# Patient Record
Sex: Male | Born: 1965 | Race: Black or African American | Hispanic: No | Marital: Married | Smoking: Current every day smoker
Health system: Southern US, Community
[De-identification: ages and names within clinical notes are randomized; demographics above are authoritative.]

## PROBLEM LIST (undated history)

## (undated) DIAGNOSIS — M549 Dorsalgia, unspecified: Secondary | ICD-10-CM

## (undated) HISTORY — PX: INGUINAL HERNIA REPAIR: SHX194

---

## 2009-09-22 ENCOUNTER — Emergency Department: Payer: Self-pay | Admitting: Emergency Medicine

## 2009-09-30 ENCOUNTER — Ambulatory Visit: Payer: Self-pay | Admitting: Podiatry

## 2012-03-10 ENCOUNTER — Emergency Department: Payer: Self-pay | Admitting: *Deleted

## 2015-09-10 ENCOUNTER — Ambulatory Visit: Payer: Self-pay | Admitting: Podiatry

## 2016-10-24 ENCOUNTER — Emergency Department: Payer: Self-pay

## 2016-10-24 ENCOUNTER — Encounter: Payer: Self-pay | Admitting: Intensive Care

## 2016-10-24 ENCOUNTER — Emergency Department
Admission: EM | Admit: 2016-10-24 | Discharge: 2016-10-24 | Disposition: A | Discharge status: Home / Self Care | Payer: Self-pay | Attending: Emergency Medicine | Admitting: Emergency Medicine

## 2016-10-24 DIAGNOSIS — N390 Urinary tract infection, site not specified: Secondary | ICD-10-CM | POA: Insufficient documentation

## 2016-10-24 DIAGNOSIS — R51 Headache: Secondary | ICD-10-CM | POA: Insufficient documentation

## 2016-10-24 DIAGNOSIS — J329 Chronic sinusitis, unspecified: Secondary | ICD-10-CM | POA: Insufficient documentation

## 2016-10-24 DIAGNOSIS — F17210 Nicotine dependence, cigarettes, uncomplicated: Secondary | ICD-10-CM | POA: Insufficient documentation

## 2016-10-24 LAB — CBC WITH DIFFERENTIAL/PLATELET
BASOS PCT: 2 %
Band Neutrophils: 2 %
Basophils Absolute: 0.1 10*3/uL (ref 0–0.1)
Blasts: 0 %
EOS PCT: 2 %
Eosinophils Absolute: 0.1 10*3/uL (ref 0–0.7)
HCT: 41 % (ref 40.0–52.0)
Hemoglobin: 13.9 g/dL (ref 13.0–18.0)
LYMPHS ABS: 0.7 10*3/uL — AB (ref 1.0–3.6)
Lymphocytes Relative: 9 %
MCH: 33.7 pg (ref 26.0–34.0)
MCHC: 34 g/dL (ref 32.0–36.0)
MCV: 99.1 fL (ref 80.0–100.0)
MONO ABS: 1.1 10*3/uL — AB (ref 0.2–1.0)
MYELOCYTES: 0 %
Metamyelocytes Relative: 0 %
Monocytes Relative: 15 %
NEUTROS PCT: 68 %
NRBC: 0 /100{WBCs}
Neutro Abs: 5.1 10*3/uL (ref 1.4–6.5)
OTHER: 2 %
PLATELETS: 231 10*3/uL (ref 150–440)
Promyelocytes Absolute: 0 %
RBC: 4.14 MIL/uL — ABNORMAL LOW (ref 4.40–5.90)
RDW: 12.8 % (ref 11.5–14.5)
WBC: 7.3 10*3/uL (ref 3.8–10.6)

## 2016-10-24 LAB — URINALYSIS COMPLETE WITH MICROSCOPIC (ARMC ONLY)
BILIRUBIN URINE: NEGATIVE
Bilirubin Urine: NEGATIVE
GLUCOSE, UA: NEGATIVE mg/dL
GLUCOSE, UA: NEGATIVE mg/dL
NITRITE: POSITIVE — AB
Nitrite: POSITIVE — AB
Protein, ur: 30 mg/dL — AB
Protein, ur: NEGATIVE mg/dL
SPECIFIC GRAVITY, URINE: 1.015 (ref 1.005–1.030)
Specific Gravity, Urine: 1.016 (ref 1.005–1.030)
pH: 5 (ref 5.0–8.0)
pH: 5 (ref 5.0–8.0)

## 2016-10-24 LAB — COMPREHENSIVE METABOLIC PANEL
ALBUMIN: 4.1 g/dL (ref 3.5–5.0)
ALK PHOS: 62 U/L (ref 38–126)
ALT: 20 U/L (ref 17–63)
ANION GAP: 5 (ref 5–15)
AST: 29 U/L (ref 15–41)
BUN: 10 mg/dL (ref 6–20)
CALCIUM: 9.4 mg/dL (ref 8.9–10.3)
CO2: 28 mmol/L (ref 22–32)
Chloride: 107 mmol/L (ref 101–111)
Creatinine, Ser: 0.94 mg/dL (ref 0.61–1.24)
GFR calc Af Amer: >60 mL/min (ref >60)
GFR calc non Af Amer: >60 mL/min (ref >60)
GLUCOSE: 97 mg/dL (ref 65–99)
Potassium: 4.3 mmol/L (ref 3.5–5.1)
SODIUM: 140 mmol/L (ref 135–145)
Total Bilirubin: 0.7 mg/dL (ref 0.3–1.2)
Total Protein: 7.4 g/dL (ref 6.5–8.1)

## 2016-10-24 LAB — LIPASE, BLOOD: Lipase: 19 U/L (ref 11–51)

## 2016-10-24 MED ORDER — IOPAMIDOL (ISOVUE-300) INJECTION 61%
30.0000 mL | Freq: Once | INTRAVENOUS | Status: AC | PRN
  Administered 2016-10-24: 30 mL via ORAL

## 2016-10-24 MED ORDER — AMOXICILLIN-POT CLAVULANATE 875-125 MG PO TABS: 1.0000 | ORAL_TABLET | Freq: Two times a day (BID) | ORAL | 0 refills | Status: AC

## 2016-10-24 MED ORDER — IOPAMIDOL (ISOVUE-300) INJECTION 61%
100.0000 mL | Freq: Once | INTRAVENOUS | Status: AC | PRN
  Administered 2016-10-24: 100 mL via INTRAVENOUS

## 2016-10-24 NOTE — ED Triage Notes (Signed)
Pt presents to ER with c/o Lower abdominal/pelvic pain that started this AM. Pt states he had an episode of emesis this AM. Denies any urinary symptoms or abnormal discharge. Pt ambulatory in triage

## 2016-10-24 NOTE — ED Notes (Signed)
EDP IN TO [facility] 

## 2016-10-24 NOTE — Discharge Instructions (Signed)
The white blood cells that were in your urine are still there. It looks like you might have a very mild urinary tract infection. [facility] or with Dr. Apolinar JunesBrandon the urologist. Bonita QuinYou also also have some sinusitis on the CAT scan of the head. That is the only thing he really showed up there. I will give you some Augmentin 1 pill twice a day with food. That should take care of both problems. Please return for any worsening headache abdominal pain fever chills vomiting or any other problems. Please take the Augmentin with food. It sometimes can cause diarrhea if you take it without food.  Please note your stool had no blood in it when I did the rectal exam. Prostate was normal on my exam as well.

## 2016-10-24 NOTE — ED Notes (Signed)
Pt returns from ct at this time.  

## 2016-10-24 NOTE — ED Provider Notes (Addendum)
[facility] Emergency Department Provider Note   ____________________________________________   First MD Initiated Contact with Patient 10/24/16 [phone]     (approximate)  I have reviewed the triage vital signs and the nursing notes.   HISTORY  Chief Complaint Abdominal Pain   HPI William Stephens is a 50 y.o. male patient reports lower abdominal pain this morning felt like cramps like he was could have diarrhea sharp stabbing for 2 or 3 minutes got better and then came back again. He has vomited at least once this morning. No fever. No diarrhea. Nothing seems to make it better or worse. He is unaware of anything that made it caused it. Pain resolved while he was waiting in the ER. He has no pain at present. Patient has a second complaint which is a mild diffuse achy headache which has been going on for the last several months. Nothing seems to make that better or worse either. His not had any fevers. The headache is not worsening. There are no other associated symptoms. As Dr. at work gave him a physical and noted he had a "dead". He reports that the eye has very blurry vision in it. 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 This was not fixed until he was a good deal older.   History reviewed. No pertinent past medical history. Patient reportedly had a Band-Aid hernia surgery. There are no active problems to display for this patient.   History reviewed. No pertinent surgical history.  Prior to Admission medications   Not on File    Allergies Patient has no known allergies.  History reviewed. No pertinent family history.  Social History Social History  Substance Use Topics  . Smoking status: Current Every Day Smoker    Types: Cigarettes  . Smokeless tobacco: Never Used  . Alcohol use 2.4 - 3.0 oz/week    4 - 5 Cans of beer per week    Review of Systems Constitutional: No fever/chills Eyes:  NoNew visual changes. ENT: No sore throat. Cardiovascular: Denies chest pain. Respiratory: Denies shortness of breath. Gastrointestinal: See history of present illness Genitourinary: Negative for dysuria. Musculoskeletal: Negative for back pain. Skin: Negative for rash. Neurological: Negative for headaches, focal weakness or numbness.  10-point ROS otherwise negative.  ____________________________________________   PHYSICAL EXAM:  VITAL SIGNS: ED Triage Vitals  Enc Vitals Group     BP 10/24/16 0703 139/79     Pulse Rate 10/24/16 0703 60     Resp 10/24/16 0703 18     Temp 10/24/16 0703 97.9 F (36.6 C)     Temp Source 10/24/16 0703 Oral     SpO2 10/24/16 0703 97 %     Weight 10/24/16 0704 145 lb (65.8 kg)     Height 10/24/16 0704 6\' 2"  (1.88 m)     Head Circumference --      Peak Flow --      Pain Score 10/24/16 0704 6     Pain Loc --      Pain Edu? --      Excl. in GC? --     Constitutional: Alert and oriented. Well appearing and in no acute distress. Eyes: Conjunctivae are normal. PERRL. EOMI.Funduscopic is normal bilaterally except for that his danger little bit more tortuous that I would expect. Head: Atraumatic. Nose: No congestion/rhinnorhea. Mouth/Throat: Mucous membranes are moist.  Oropharynx non-erythematous. Neck: No stridor. Cardiovascular: Normal rate, regular  rhythm. Grossly normal heart sounds.  Good peripheral circulation. Respiratory: Normal respiratory effort.  No retractions. Lungs CTAB. Gastrointestinal: Soft and nontender. No distention. No abdominal bruits. No CVA tenderness. Musculoskeletal: No lower extremity tenderness nor edema.  No joint effusions. Neurologic:  Normal speech and language. No gross focal neurologic deficits are appreciated. Cranial nerves II through XII are intact cerebellar finger-nose rapid alternating movements in the hands are normal heel-to-shin is normal motor strength is 5 over 5 throughout sensation is intact DTRs are  normal in the knees and ankles. No gait instability. Skin:  Skin is warm, dry and intact. No rash noted. Rectal: Hemoccult negative. Prostate nontender. Please note patient is not circumcised. He did not clean the head of his penis to pull back the foreskin when he urinated. We'll try and repeat the urine and see if that clears up for white blood cells. Patient has no symptoms at present.   ____________________________________________   LABS (all labs ordered are listed, but only abnormal results are displayed)  Labs Reviewed  COMPREHENSIVE METABOLIC PANEL  LIPASE, BLOOD  CBC WITH DIFFERENTIAL/PLATELET  URINALYSIS COMPLETEWITH MICROSCOPIC ([facility] ONLY)   ____________________________________________  EKG   ____________________________________________  RADIOLOGY  Result   CLINICAL DATA:  Lower abdominal and pelvic pain starting today. Emesis this morning.  EXAM: ABDOMEN - 1 VIEW  COMPARISON:  Report from lumbar spine radiographs dated 03/27/2015  FINDINGS: Hernia mesh markers in the left lower quadrant. No dilated bowel. Paucity of bowel gas in the left lower quadrant. Unusual striated pattern of bowel gas in the left abdomen, difficult to exclude fold thickening or even pneumatosis, but not considered specific.  IMPRESSION: 1. There are some unusual features of the bowel gas pattern in the left abdomen, with some striated longitudinal gas in the left upper quadrant possibly from fold thickening or conceivably even pneumatosis, and with some paucity of bowel gas in the left lower quadrant. Prior left lower quadrant hernia repair. No dilated bowel to further suggest obstruction. CT could be utilized for further characterization if the patient's symptoms warrant.   Electronically Signed   By: Walter  Liebkemann M.D.   On: 10/24/2016 08:36   __Study Result   CLINICAL DATA:  Frontal and parietal headache x 1 week, Mid abd. Pain with n/v since this am, hx of  hernia repair,  EXAM: CT ABDOMEN AND PELVIS WITH CONTRAST  TECHNIQUE: Multidetector CT imaging of the abdomen and pelvis was performed using the standard protocol following bolus administration of intravenous contrast.  CONTRAST:  <[id number] consulCletus GasG76P10 (161097)BennaEnvironmental consulCletus GasG53P10 (161099)BennaEnvironmental consulCletus GasG81P10 [id number] consulCletus GasG48P10<MEASUREMENT1610962BennaEnvironmental consulCletus GasG54P10<MEASUREMENT1610918BennaEnvironmental consulCletus GasG64P10 (161094)BennaEnvironmental consulCletus GasG45P10<MEASUREME16109>6BennaEnvironmental consulCletus GasG75P10<MEASUREMENT1610913BennaEnvironmental consulCletus GasG72P10 1610951BennaEnvironmental consulCletus GasG32P10 1610979BennaEnvironmental consulCletus GasG79P10<MEASUREMENT1610910BennaEnvironmental consulCletus GasG8P10<MEASUREME16109>8BennaEnvironmental consulCletus GasG43P10 (161092)BennaEnvironmental consulCletus GasG74P10<MEASUREMENT1610902BennaEnvironmental consulCletus GasG13P10<MEASUREMENT1610901BennaEnvironmental consulCletus GasG60P10<MEASUREMENT1610908BennaEnvironmental consulCletus GasG60P10<MEASUREME16109>2BennaEnvironmental consulCletus GasG35P10<MEASUREME16109>4BennaEnvironmental consulCletus GasG31P10TranspJairo BenlyOPAMIDOL (ISOVUE-300) INJECTION 61%  COMPARISON:  None.  FINDINGS: Lower chest: Linear scarring or subsegmental atelectasis posteriorly in the visualized lung bases. No pleural or pericardial effusion.  Hepatobiliary: 12 mm poorly marginated region of transient attenuation difference in hepatic segment 7 on portal venous phase imaging, inconspicuous on delayed scans. Gallbladder nondistended. No intra or extrahepatic biliary ductal dilatation.  Pancreas: Unremarkable. No pancreatic ductal dilatation or surrounding inflammatory changes.  Spleen: Normal in size without focal abnormality.  Adrenals/Urinary Tract: Adrenal glands are unremarkable. Kidneys are normal, without renal calculi, focal lesion, or hydronephrosis. Bladder is incompletely distended, appearing somewhat thick walled.  Stomach/Bowel: Stomach physiologically distended. Small bowel nondilated. Normal appendix. Colon is nondilated.  Vascular/Lymphatic: Patchy aortoiliac arterial calcifications without aneurysm or stenosis. No adenopathy localized.  Reproductive: Prostate is unremarkable.  Other: No ascites.  No free air.  Musculoskeletal: Changes of laparoscopic left inguinal hernia repair. Mild degenerative disc disease L5-S1. No fracture or other acute bone finding.  IMPRESSION:  1. No acute abdominal process. 2. Thick-walled urinary bladder, incompletely distended. 3.  Aortic Atherosclerosis (ICD10-170.0) 4. Nonspecific 12 mm hepatic process in segment 7. Presuming no history of primary malignancy or liver disease, this likely represents a benign entity such as a hemangioma. If there is any history of liver disease or primary malignancy,  further evaluation with dedicated abdominal MR should be considered.   Electronically Signed   By: Corlis Leak  Hassell M.D.   On: 10/24/2016 12:59   _________Study Result   CLINICAL DATA:  Frontal and parietal headache x 1 week, Mid abd. Pain with n/v since this am, hx of hernia repair,  EXAM: CT HEAD WITHOUT CONTRAST  TECHNIQUE: Contiguous axial images were obtained from the base of the skull through the vertex without intravenous contrast.  COMPARISON:  None.  FINDINGS: Brain: Mild atrophy. No evidence of acute infarction, hemorrhage, hydrocephalus, extra-axial collection or mass lesion/mass effect.  Vascular: No hyperdense vessel or unexpected calcification.  Skull: Normal. Negative for fracture or focal lesion.  Sinuses/Orbits: Patchy opacification of ethmoid air cells and partial opacification of bilateral maxillary sinuses, incompletely visualized.  Other: None.  IMPRESSION: 1. Negative for bleed or other acute intracranial process. 2. Bilateral ethmoid and maxillary sinus disease.   Electronically Signed   By: Corlis Leak  Hassell M.D.   On: 10/24/2016 11:49   _________________________________   PROCEDURES  Procedure(s) performed:   Procedures  Critical Care performed:   ____________________________________________   INITIAL IMPRESSION / ASSESSMENT AND PLAN / ED COURSE  Pertinent labs & imaging results that were available during my care of the patient were reviewed by me and considered in my medical decision making (see chart for details).    Clinical Course      ____________________________________________   FINAL CLINICAL IMPRESSION(S) / ED DIAGNOSES  Final diagnoses:  None      NEW MEDICATIONS STARTED DURING THIS VISIT:  New Prescriptions   No medications on file     Note:  This document was prepared using Dragon voice recognition software and may include unintentional dictation errors.    Arnaldo NatalPaul F Marvine Encalade, MD 10/24/16  1101    Arnaldo NatalPaul F Lorrinda Ramstad, MD 10/24/16 [phone]

## 2016-10-27 LAB — PATHOLOGIST SMEAR REVIEW

## 2018-12-13 ENCOUNTER — Other Ambulatory Visit: Payer: Self-pay

## 2018-12-13 DIAGNOSIS — Z1211 Encounter for screening for malignant neoplasm of colon: Secondary | ICD-10-CM

## 2018-12-29 ENCOUNTER — Encounter: Payer: Self-pay | Admitting: Anesthesiology

## 2018-12-31 ENCOUNTER — Ambulatory Visit
Admission: RE | Admit: 2018-12-31 | Discharge: 2018-12-31 | Disposition: A | Discharge status: Home / Self Care | Payer: 59 | Attending: Gastroenterology | Admitting: Gastroenterology

## 2018-12-31 ENCOUNTER — Encounter: Payer: Self-pay | Admitting: *Deleted

## 2018-12-31 ENCOUNTER — Encounter
Admission: RE | Disposition: A | Discharge status: Home / Self Care | Payer: Self-pay | Source: Home / Self Care | Attending: Gastroenterology

## 2018-12-31 ENCOUNTER — Ambulatory Visit: Payer: 59 | Admitting: Anesthesiology

## 2018-12-31 ENCOUNTER — Other Ambulatory Visit: Payer: Self-pay

## 2018-12-31 DIAGNOSIS — F17210 Nicotine dependence, cigarettes, uncomplicated: Secondary | ICD-10-CM | POA: Diagnosis not present

## 2018-12-31 DIAGNOSIS — K644 Residual hemorrhoidal skin tags: Secondary | ICD-10-CM | POA: Insufficient documentation

## 2018-12-31 DIAGNOSIS — Z1211 Encounter for screening for malignant neoplasm of colon: Secondary | ICD-10-CM | POA: Diagnosis present

## 2018-12-31 DIAGNOSIS — K621 Rectal polyp: Secondary | ICD-10-CM | POA: Diagnosis not present

## 2018-12-31 HISTORY — PX: COLONOSCOPY WITH PROPOFOL: SHX5780

## 2018-12-31 HISTORY — DX: Dorsalgia, unspecified: M54.9

## 2018-12-31 SURGERY — COLONOSCOPY WITH PROPOFOL
Anesthesia: General

## 2018-12-31 MED ORDER — PROPOFOL 10 MG/ML IV BOLUS
INTRAVENOUS | Status: DC | PRN
  Administered 2018-12-31: 125 mg via INTRAVENOUS
  Administered 2018-12-31: 30 mg via INTRAVENOUS

## 2018-12-31 MED ORDER — SODIUM CHLORIDE 0.9 % IV SOLN
INTRAVENOUS | Status: DC
  Administered 2018-12-31: 09:00:00 via INTRAVENOUS

## 2018-12-31 MED ORDER — LIDOCAINE HCL (PF) 2 % IJ SOLN
INTRAMUSCULAR | Status: AC
  Filled 2018-12-31: qty 10

## 2018-12-31 MED ORDER — PROPOFOL 500 MG/50ML IV EMUL
INTRAVENOUS | Status: DC | PRN
  Administered 2018-12-31: 150 ug/kg/min via INTRAVENOUS

## 2018-12-31 MED ORDER — LIDOCAINE HCL (CARDIAC) PF 100 MG/5ML IV SOSY
PREFILLED_SYRINGE | INTRAVENOUS | Status: DC | PRN
  Administered 2018-12-31: 30 mg via INTRAVENOUS

## 2018-12-31 MED ORDER — PROPOFOL 500 MG/50ML IV EMUL
INTRAVENOUS | Status: AC
  Filled 2018-12-31: qty 50

## 2018-12-31 NOTE — Transfer of Care (Signed)
Immediate Anesthesia Transfer of Care Note  Patient: William Stephens  Procedure(s) Performed: COLONOSCOPY WITH PROPOFOL (N/A )  Patient Location: PACU and Endoscopy Unit  Anesthesia Type:General  Level of Consciousness: awake  Airway & Oxygen Therapy: Patient Spontanous Breathing  Post-op Assessment: Report given to RN  Post vital signs: stable  Last Vitals:  Vitals Value Taken Time  BP 104/75 12/31/2018  9:45 AM  Temp    Pulse 66 12/31/2018  9:45 AM  Resp 21 12/31/2018  9:45 AM  SpO2 99 % 12/31/2018  9:45 AM  Vitals shown include unvalidated device data.  Last Pain:  Vitals:   12/31/18 0819  TempSrc: Tympanic  PainSc: 0-No pain         Complications: No apparent anesthesia complications

## 2018-12-31 NOTE — Anesthesia Post-op Follow-up Note (Signed)
Anesthesia QCDR form completed.        

## 2018-12-31 NOTE — H&P (Signed)
Arlyss Repress, MD [address]  [address]  [place], [place] [address]  Main: [phone]  Fax: [phone] Pager: [phone]  Primary Care Physician:  Corky Downs, MD Primary Gastroenterologist:  Dr. Arlyss Repress  Pre-Procedure History & Physical: HPI:  William Stephens is a 53 y.o. male is here for an colonoscopy.   Past Medical History:  Diagnosis Date  . Back pain     Past Surgical History:  Procedure Laterality Date  . INGUINAL HERNIA REPAIR Right     Prior to Admission medications   Medication Sig Start Date End Date Taking? Authorizing Provider  ibuprofen (ADVIL,MOTRIN) 800 MG tablet Take 800 mg by mouth every 8 (eight) hours as needed.   Yes [provider]  Multiple Vitamin (MULTIVITAMIN) capsule Take 1 capsule by mouth daily.   Yes [provider]    Allergies as of 12/13/2018  . (No Known Allergies)    History reviewed. No pertinent family history.  Social History   Socioeconomic History  . Marital status: Married    Spouse name: Not on file  . Number of children: Not on file  . Years of education: Not on file  . Highest education level: Not on file  Occupational History  . Not on file  Social Needs  . Financial resource strain: Not on file  . Food insecurity:    Worry: Not on file    Inability: Not on file  . Transportation needs:    Medical: Not on file    Non-medical: Not on file  Tobacco Use  . Smoking status: Current Every Day Smoker    Packs/day: 0.50    Types: Cigarettes  . Smokeless tobacco: Never Used  Substance and Sexual Activity  . Alcohol use: Yes    Alcohol/week: 4.0 - 5.0 standard drinks    Types: 4 - 5 Cans of beer per week  . Drug use: No  . Sexual activity: Yes  Lifestyle  . Physical activity:    Days per week: Not on file    Minutes per session: Not on file  . Stress: Not on file  Relationships  . Social connections:    Talks on phone: Not on file    Gets together: Not on file   Attends religious service: Not on file    Active member of club or organization: Not on file    Attends meetings of clubs or organizations: Not on file    Relationship status: Not on file  . Intimate partner violence:    Fear of current or ex partner: Not on file    Emotionally abused: Not on file    Physically abused: Not on file    Forced sexual activity: Not on file  Other Topics Concern  . Not on file  Social History Narrative  . Not on file    Review of Systems: See HPI, otherwise negative ROS  Physical Exam: BP 137/85   Pulse 66   Temp (!) 96.1 F (35.6 C) (Tympanic)   Resp 18   Ht 6\' 2"  (1.88 m)   Wt 68 kg   SpO2 99%   BMI 19.26 kg/m  General:   Alert,  pleasant and cooperative in NAD Head:  Normocephalic and atraumatic. Neck:  Supple; no masses or thyromegaly. Lungs:  Clear throughout to auscultation.    Heart:  Regular rate and rhythm. Abdomen:  Soft, nontender and nondistended. Normal bowel sounds, without guarding, and without rebound.   Neurologic:  Alert and  oriented x4;  grossly normal neurologically.  Impression/Plan: William Stephens is here for an colonoscopy to be performed for colon cancer screening  Risks, benefits, limitations, and alternatives regarding  colonoscopy have been reviewed with the patient.  Questions have been answered.  All parties agreeable.   William Donathohini Finnegan Gatta, MD  12/31/2018, 9:14 AM

## 2018-12-31 NOTE — Anesthesia Postprocedure Evaluation (Signed)
Anesthesia Post Note  Patient: William Stephens  Procedure(s) Performed: COLONOSCOPY WITH PROPOFOL (N/A )  Patient location during evaluation: Endoscopy Anesthesia Type: General Level of consciousness: awake and alert Pain management: pain level controlled Vital Signs Assessment: post-procedure vital signs reviewed and stable Respiratory status: spontaneous breathing, nonlabored ventilation, respiratory function stable and patient connected to nasal cannula oxygen Cardiovascular status: blood pressure returned to baseline and stable Postop Assessment: no apparent nausea or vomiting Anesthetic complications: no     Last Vitals:  Vitals:   12/31/18 1005 12/31/18 1015  BP: 124/79 (!) 131/92  Pulse: 63 (!) 57  Resp: 16 13  Temp:    SpO2: 96% 100%    Last Pain:  Vitals:   12/31/18 1015  TempSrc:   PainSc: 0-No pain                 Cordney Barstow S

## 2018-12-31 NOTE — Anesthesia Preprocedure Evaluation (Signed)
Anesthesia Evaluation  Patient identified by MRN, date of birth, ID band Patient awake    Reviewed: Allergy & Precautions, NPO status , Patient's Chart, lab work & pertinent test results, reviewed documented beta blocker date and time   Airway Mallampati: II  TM Distance: >3 FB     Dental  (+) Chipped   Pulmonary Current Smoker,           Cardiovascular      Neuro/Psych    GI/Hepatic   Endo/Other    Renal/GU      Musculoskeletal   Abdominal   Peds  Hematology   Anesthesia Other Findings   Reproductive/Obstetrics                             Anesthesia Physical Anesthesia Plan  ASA: II  Anesthesia Plan: General   Post-op Pain Management:    Induction: Intravenous  PONV Risk Score and Plan:   Airway Management Planned:   Additional Equipment:   Intra-op Plan:   Post-operative Plan:   Informed Consent: I have reviewed the patients History and Physical, chart, labs and discussed the procedure including the risks, benefits and alternatives for the proposed anesthesia with the patient or authorized representative who has indicated his/her understanding and acceptance.       Plan Discussed with: CRNA  Anesthesia Plan Comments:         Anesthesia Quick Evaluation  

## 2018-12-31 NOTE — Op Note (Signed)
[facility] Gastroenterology Patient Name: William Stephens Procedure Date: 12/31/2018 9:16 AM MRN: [id number] Account #: [id number] Date of Birth: [birth year] Admit Type: Outpatient Age: 53 Room: [facility] ENDO ROOM 2 Gender: Male Note Status: Finalized Procedure:            Colonoscopy Indications:          Screening for colorectal malignant neoplasm, This is                        the patient's first colonoscopy Providers:            Lin Landsman MD, MD Referring MD:         Cletis Athens, MD (Referring MD) Medicines:            Monitored Anesthesia Care Complications:        No immediate complications. Estimated blood loss: None. Procedure:            Pre-Anesthesia Assessment:                       - Prior to the procedure, a History and Physical was                        performed, and patient medications and allergies were                        reviewed. The patient is competent. The risks and                        benefits of the procedure and the sedation options and                        risks were discussed with the patient. All questions                        were answered and informed consent was obtained.                        Patient identification and proposed procedure were                        verified by the physician, the nurse, the                        anesthesiologist, the anesthetist and the technician in                        the pre-procedure area in the procedure room in the                        endoscopy suite. Mental Status Examination: alert and                        oriented. Airway Examination: normal oropharyngeal                        airway and neck mobility. Respiratory Examination:                        clear to auscultation. CV Examination: normal.  Prophylactic Antibiotics: The patient does not require                        prophylactic antibiotics. Prior Anticoagulants: The                         patient has taken no previous anticoagulant or                        antiplatelet agents. ASA Grade Assessment: II - A                        patient with mild systemic disease. After reviewing the                        risks and benefits, the patient was deemed in                        satisfactory condition to undergo the procedure. The                        anesthesia plan was to use monitored anesthesia care                        (MAC). Immediately prior to administration of                        medications, the patient was re-assessed for adequacy                        to receive sedatives. The heart rate, respiratory rate,                        oxygen saturations, blood pressure, adequacy of                        pulmonary ventilation, and response to care were                        monitored throughout the procedure. The physical status                        of the patient was re-assessed after the procedure.                       After obtaining informed consent, the colonoscope was                        passed under direct vision. Throughout the procedure,                        the patient's blood pressure, pulse, and oxygen                        saturations were monitored continuously. The                        Colonoscope was introduced through the anus and  advanced to the the cecum, identified by appendiceal                        orifice and ileocecal valve. The colonoscopy was                        performed without difficulty. The patient tolerated the                        procedure well. The quality of the bowel preparation                        was evaluated using the BBPS [facility] Bowel Preparation                        Scale) with scores of: Right Colon = 3, Transverse                        Colon = 3 and Left Colon = 3 (entire mucosa seen well                        with no residual staining, small fragments of stool or                         opaque liquid). The total BBPS score equals 9. Findings:      The perianal and digital rectal examinations were normal. Pertinent       negatives include normal sphincter tone and no palpable rectal lesions.      A 7 mm polyp was found in the rectum. The polyp was semi-pedunculated.       The polyp was removed with a hot snare. Resection and retrieval were       complete.      External hemorrhoids were found during retroflexion. The hemorrhoids       were medium-sized.      The exam was otherwise without abnormality. Impression:           - One 7 mm polyp in the rectum, removed with a hot                        snare. Resected and retrieved.                       - External hemorrhoids.                       - The examination was otherwise normal. Recommendation:       - Discharge patient to home (with escort).                       - Resume previous diet today.                       - Continue present medications.                       - Await pathology results.                       - Repeat colonoscopy in 5 years for surveillance based  on pathology results. Procedure Code(s):    --- Professional ---                       [phone], Colonoscopy, flexible; with removal of tumor(s),                        polyp(s), or other lesion(s) by snare technique Diagnosis Code(s):    --- Professional ---                       Z12.11, Encounter for screening for malignant neoplasm                        of colon                       K62.1, Rectal polyp                       K64.4, Residual hemorrhoidal skin tags CPT copyright 2018 American Medical Association. All rights reserved. The codes documented in this report are preliminary and upon coder review may  be revised to meet current compliance requirements. Dr. Ulyess Mort Lin Landsman MD, MD 12/31/2018 9:42:59 AM This report has been signed electronically. Number of Addenda: 0 Note  Initiated On: 12/31/2018 9:16 AM Scope Withdrawal Time: 0 hours 9 minutes 59 seconds  Total Procedure Duration: 0 hours 15 minutes 20 seconds       [facility]

## 2019-01-01 ENCOUNTER — Encounter: Payer: Self-pay | Admitting: Gastroenterology

## 2019-01-01 LAB — SURGICAL PATHOLOGY

## 2019-01-02 ENCOUNTER — Encounter: Payer: Self-pay | Admitting: Gastroenterology

## 2019-07-16 ENCOUNTER — Other Ambulatory Visit: Payer: Self-pay | Admitting: Family Medicine

## 2019-07-16 DIAGNOSIS — Z20822 Contact with and (suspected) exposure to covid-19: Secondary | ICD-10-CM

## 2019-07-17 ENCOUNTER — Other Ambulatory Visit: Payer: Self-pay

## 2019-07-17 DIAGNOSIS — Z20822 Contact with and (suspected) exposure to covid-19: Secondary | ICD-10-CM

## 2019-07-18 LAB — NOVEL CORONAVIRUS, NAA: SARS-CoV-2, NAA: NOT DETECTED

## 2019-12-04 ENCOUNTER — Emergency Department: Payer: BC Managed Care – PPO

## 2019-12-04 ENCOUNTER — Other Ambulatory Visit: Payer: Self-pay

## 2019-12-04 ENCOUNTER — Emergency Department
Admission: EM | Admit: 2019-12-04 | Discharge: 2019-12-04 | Disposition: A | Discharge status: Home / Self Care | Payer: BC Managed Care – PPO | Attending: Student in an Organized Health Care Education/Training Program | Admitting: Student in an Organized Health Care Education/Training Program

## 2019-12-04 ENCOUNTER — Encounter: Payer: Self-pay | Admitting: Emergency Medicine

## 2019-12-04 DIAGNOSIS — F17210 Nicotine dependence, cigarettes, uncomplicated: Secondary | ICD-10-CM | POA: Diagnosis not present

## 2019-12-04 DIAGNOSIS — R1013 Epigastric pain: Secondary | ICD-10-CM | POA: Diagnosis present

## 2019-12-04 DIAGNOSIS — K852 Alcohol induced acute pancreatitis without necrosis or infection: Secondary | ICD-10-CM | POA: Insufficient documentation

## 2019-12-04 DIAGNOSIS — Z79899 Other long term (current) drug therapy: Secondary | ICD-10-CM | POA: Diagnosis not present

## 2019-12-04 LAB — BASIC METABOLIC PANEL
Anion gap: 11 (ref 5–15)
BUN: 12 mg/dL (ref 6–20)
CO2: 25 mmol/L (ref 22–32)
Calcium: 9.3 mg/dL (ref 8.9–10.3)
Chloride: 102 mmol/L (ref 98–111)
Creatinine, Ser: 0.96 mg/dL (ref 0.61–1.24)
GFR calc Af Amer: >60 mL/min (ref >60)
GFR calc non Af Amer: >60 mL/min (ref >60)
Glucose, Bld: 119 mg/dL — ABNORMAL HIGH (ref 70–99)
Potassium: 4.1 mmol/L (ref 3.5–5.1)
Sodium: 138 mmol/L (ref 135–145)

## 2019-12-04 LAB — CBC
HCT: 37.1 % — ABNORMAL LOW (ref 39.0–52.0)
Hemoglobin: 12.9 g/dL — ABNORMAL LOW (ref 13.0–17.0)
MCH: 33.2 pg (ref 26.0–34.0)
MCHC: 34.8 g/dL (ref 30.0–36.0)
MCV: 95.6 fL (ref 80.0–100.0)
Platelets: 204 10*3/uL (ref 150–400)
RBC: 3.88 MIL/uL — ABNORMAL LOW (ref 4.22–5.81)
RDW: 11.9 % (ref 11.5–15.5)
WBC: 9.3 10*3/uL (ref 4.0–10.5)
nRBC: 0 % (ref 0.0–0.2)

## 2019-12-04 LAB — HEPATIC FUNCTION PANEL
ALT: 20 U/L (ref 0–44)
AST: 23 U/L (ref 15–41)
Albumin: 4.5 g/dL (ref 3.5–5.0)
Alkaline Phosphatase: 47 U/L (ref 38–126)
Bilirubin, Direct: 0.2 mg/dL (ref 0.0–0.2)
Indirect Bilirubin: 1.1 mg/dL — ABNORMAL HIGH (ref 0.3–0.9)
Total Bilirubin: 1.3 mg/dL — ABNORMAL HIGH (ref 0.3–1.2)
Total Protein: 7.2 g/dL (ref 6.5–8.1)

## 2019-12-04 LAB — LIPASE, BLOOD: Lipase: 59 U/L — ABNORMAL HIGH (ref 11–51)

## 2019-12-04 LAB — TROPONIN I (HIGH SENSITIVITY)
Troponin I (High Sensitivity): 3 ng/L (ref <18)
Troponin I (High Sensitivity): 4 ng/L (ref <18)

## 2019-12-04 MED ORDER — OXYCODONE-ACETAMINOPHEN 5-325 MG PO TABS: 1.0000 | ORAL_TABLET | ORAL | 0 refills | Status: AC | PRN

## 2019-12-04 MED ORDER — PROMETHAZINE HCL 25 MG PO TABS: 25.0000 mg | ORAL_TABLET | Freq: Four times a day (QID) | ORAL | 0 refills | Status: AC | PRN

## 2019-12-04 MED ORDER — ONDANSETRON 4 MG PO TBDP
4.0000 mg | ORAL_TABLET | Freq: Once | ORAL | Status: AC
  Administered 2019-12-04: 4 mg via ORAL
  Filled 2019-12-04: qty 1

## 2019-12-04 MED ORDER — IOHEXOL 300 MG/ML  SOLN
100.0000 mL | Freq: Once | INTRAMUSCULAR | Status: AC | PRN
  Administered 2019-12-04: 19:00:00 100 mL via INTRAVENOUS
  Filled 2019-12-04: qty 100

## 2019-12-04 MED ORDER — OXYCODONE-ACETAMINOPHEN 5-325 MG PO TABS
1.0000 | ORAL_TABLET | Freq: Once | ORAL | Status: AC
  Administered 2019-12-04: 1 via ORAL
  Filled 2019-12-04: qty 1

## 2019-12-04 MED ORDER — LIDOCAINE VISCOUS HCL 2 % MT SOLN
15.0000 mL | Freq: Once | OROMUCOSAL | Status: AC
  Administered 2019-12-04: 15 mL via ORAL
  Filled 2019-12-04: qty 15

## 2019-12-04 MED ORDER — ALUM & MAG HYDROXIDE-SIMETH 200-200-20 MG/5ML PO SUSP
30.0000 mL | Freq: Once | ORAL | Status: AC
  Administered 2019-12-04: 30 mL via ORAL
  Filled 2019-12-04: qty 30

## 2019-12-04 NOTE — ED Triage Notes (Signed)
Pt reports CP to his left chest that is heavy in nature in addition to nausea. Pt reports sx's started today around 11:30 and have been constant since then.

## 2019-12-04 NOTE — Discharge Instructions (Signed)
You have been seen in the emergency department for emergency care. It is important that you contact your own doctor, specialist or the closest clinic for follow-up care. Please bring this instruction sheet, all medications and X-ray copies with you when you are seen for follow-up care.  Please return immediately to the Emergency Department for fever>101, Vomiting or Intractable Pain. You should return to the emergency department or see your primary care provider in 12-24hrs if your pain is no better and sooner if your pain becomes worse.

## 2019-12-04 NOTE — ED Provider Notes (Signed)
[facility] Emergency Department Provider Note    First MD Initiated Contact with Patient 12/04/19 1751     (approximate)  I have reviewed the triage vital signs and the nursing notes.   HISTORY  Chief Complaint Chest Pain and Nausea    HPI William Stephens is a 53 y.o. male below listed past medical history as well as high pack-a-day smoking history presents the ER for evaluation of epigastric discomfort as well as chest pain and pressure.  Denies any diaphoresis.  States that he is no alleviating factors.  Does endorse nausea as well as having some palpitations.  States that he has been evaluated for similar symptoms by cardiology with reassuring work-up.  Also states he had a reassuring stress test this year done as an outpatient.  Does not have any pleuritic chest pain.  No pain radiating through to his back.  States he does have a history of acid reflux.     Past Medical History:  Diagnosis Date  . Back pain    No family history on file. Past Surgical History:  Procedure Laterality Date  . COLONOSCOPY WITH PROPOFOL N/A 12/31/2018   Procedure: COLONOSCOPY WITH PROPOFOL;  Surgeon: Toney ReilVanga, Rohini Reddy, MD;  Location: [facility] ENDOSCOPY;  Service: Gastroenterology;  Laterality: N/A;  . INGUINAL HERNIA REPAIR Right    Patient Active Problem List   Diagnosis Date Noted  . Encounter for screening colonoscopy       Prior to Admission medications   Medication Sig Start Date End Date Taking? Authorizing Provider  Multiple Vitamin (MULTIVITAMIN) capsule Take 1 capsule by mouth daily.    [provider]  oxyCODONE-acetaminophen (PERCOCET) 5-325 MG tablet Take 1 tablet by mouth every 4 (four) hours as needed for severe pain. 12/04/19 12/03/20  Willy Eddyobinson, Mylez Venable, MD  promethazine (PHENERGAN) 25 MG tablet Take 1 tablet (25 mg total) by mouth every 6 (six) hours as needed. 12/04/19   Willy Eddyobinson, Domenica Weightman, MD    Allergies Patient has no known  allergies.    Social History Social History   Tobacco Use  . Smoking status: Current Every Day Smoker    Packs/day: 0.50    Types: Cigarettes  . Smokeless tobacco: Never Used  Substance Use Topics  . Alcohol use: Yes    Alcohol/week: 4.0 - 5.0 standard drinks    Types: 4 - 5 Cans of beer per week  . Drug use: No    Review of Systems Patient denies headaches, rhinorrhea, blurry vision, numbness, shortness of breath, chest pain, edema, cough, abdominal pain, nausea, vomiting, diarrhea, dysuria, fevers, rashes or hallucinations unless otherwise stated above in HPI. ____________________________________________   PHYSICAL EXAM:  VITAL SIGNS: Vitals:   12/04/19 1603  BP: (!) 158/79  Pulse: (!) 43  Resp: 18  Temp: 98.4 F (36.9 C)  SpO2: 100%    Constitutional: Alert and oriented.  Eyes: Conjunctivae are normal.  Head: Atraumatic. Nose: No congestion/rhinnorhea. Mouth/Throat: Mucous membranes are moist.   Neck: No stridor. Painless ROM.  Cardiovascular: Normal rate, regular rhythm. Grossly normal heart sounds.  Good peripheral circulation. Respiratory: Normal respiratory effort.  No retractions. Lungs CTAB. Gastrointestinal: Soft and nontender. No distention. No abdominal bruits. No CVA tenderness. Genitourinary:  Musculoskeletal: No lower extremity tenderness nor edema.  No joint effusions. Neurologic:  Normal speech and language. No gross focal neurologic deficits are appreciated. No facial droop Skin:  Skin is warm, dry and intact. No rash noted. Psychiatric: Mood and affect are normal. Speech and behavior are  normal.  ____________________________________________   LABS (all labs ordered are listed, but only abnormal results are displayed)  Results for orders placed or performed during the hospital encounter of 12/04/19 (from the past 24 hour(s))  Basic metabolic panel     Status: Abnormal   Collection Time: 12/04/19  4:05 PM  Result Value Ref Range   Sodium  138 135 - 145 mmol/L   Potassium 4.1 3.5 - 5.1 mmol/L   Chloride 102 98 - 111 mmol/L   CO2 25 22 - 32 mmol/L   Glucose, Bld 119 (H) 70 - 99 mg/dL   BUN 12 6 - 20 mg/dL   Creatinine, Ser 0.96 0.61 - 1.24 mg/dL   Calcium 9.3 8.9 - 10.3 mg/dL   GFR calc non Af Amer >60 >60 mL/min   GFR calc Af Amer >60 >60 mL/min   Anion gap 11 5 - 15  CBC     Status: Abnormal   Collection Time: 12/04/19  4:05 PM  Result Value Ref Range   WBC 9.3 4.0 - 10.5 K/uL   RBC 3.88 (L) 4.22 - 5.81 MIL/uL   Hemoglobin 12.9 (L) 13.0 - 17.0 g/dL   HCT 37.1 (L) 39.0 - 52.0 %   MCV 95.6 80.0 - 100.0 fL   MCH 33.2 26.0 - 34.0 pg   MCHC 34.8 30.0 - 36.0 g/dL   RDW 11.9 11.5 - 15.5 %   Platelets 204 150 - 400 K/uL   nRBC 0.0 0.0 - 0.2 %  Troponin I (High Sensitivity)     Status: None   Collection Time: 12/04/19  4:05 PM  Result Value Ref Range   Troponin I (High Sensitivity) 3 <18 ng/L  Hepatic function panel     Status: Abnormal   Collection Time: 12/04/19  4:05 PM  Result Value Ref Range   Total Protein 7.2 6.5 - 8.1 g/dL   Albumin 4.5 3.5 - 5.0 g/dL   AST 23 15 - 41 U/L   ALT 20 0 - 44 U/L   Alkaline Phosphatase 47 38 - 126 U/L   Total Bilirubin 1.3 (H) 0.3 - 1.2 mg/dL   Bilirubin, Direct 0.2 0.0 - 0.2 mg/dL   Indirect Bilirubin 1.1 (H) 0.3 - 0.9 mg/dL  Lipase, blood     Status: Abnormal   Collection Time: 12/04/19  4:05 PM  Result Value Ref Range   Lipase 59 (H) 11 - 51 U/L  Troponin I (High Sensitivity)     Status: None   Collection Time: 12/04/19  6:25 PM  Result Value Ref Range   Troponin I (High Sensitivity) 4 <18 ng/L   ____________________________________________  EKG My review and personal interpretation at Time: 15:54   Indication: chest pain  Rate: 80  Rhythm: sinus with frequent pvcs Axis: normal Other: no stemi criteria ____________________________________________  RADIOLOGY  I personally reviewed all radiographic images ordered to evaluate for the above acute complaints and  reviewed radiology reports and findings.  These findings were personally discussed with the patient.  Please see medical record for radiology report.  ____________________________________________   PROCEDURES  Procedure(s) performed:  Procedures    Critical Care performed: no ____________________________________________   INITIAL IMPRESSION / ASSESSMENT AND PLAN / ED COURSE  Pertinent labs & imaging results that were available during my care of the patient were reviewed by me and considered in my medical decision making (see chart for details).   DDX: acs, dysrhythmia, chf, gastritis, pna, dissection, pancreatitis, colitis, coivd William Stephens is  a 19 y.o. who presents to the ED with symptoms as described above patient arrives afebrile hemodynamically stable no acute distress.  Somewhat difficult to differentiate as he is also having some epigastric pain.  Had initial chest pain labs ordered in triage which are fortunately reassuring.  EKG with some nonspecific changes in frequent PVCs but no evidence of acute ischemia.  I am concerned his pain may be more likely referred from his abdomen.  Will add on LFTs lipase and order CT imaging.  Clinical Course as of Dec 03 2054  Wed Dec 04, 2019  2027 Work-up does show evidence of mild pancreatitis.  He is currently with minimal symptoms.  Questing something to drink.  Will try p.o. as well as symptomatic management.  If he is able to tolerate p.o. hydration and pain is controlled I think he be an appropriate candidate for trial of outpatient management with outpatient referral to GI.   [PR]  2054 Patient tolerating p.o.  Pain controlled with oral medication.  Discussed conservative management and outpatient follow-up.  Discussed signs and symptoms for which he should return to the ER.   [PR]    Clinical Course User Index [PR] Willy Eddy, MD    The patient was evaluated in Emergency Department today for the symptoms  described in the history of present illness. He/she was evaluated in the context of the global COVID-19 pandemic, which necessitated consideration that the patient might be at risk for infection with the SARS-CoV-2 virus that causes COVID-19. Institutional protocols and algorithms that pertain to the evaluation of patients at risk for COVID-19 are in a state of rapid change based on information released by regulatory bodies including the CDC and federal and state organizations. These policies and algorithms were followed during the patient's care in the ED.  As part of my medical decision making, I reviewed the following data within the electronic MEDICAL RECORD NUMBER Nursing notes reviewed and incorporated, Labs reviewed, notes from prior ED visits and [place] Controlled Substance Database   ____________________________________________   FINAL CLINICAL IMPRESSION(S) / ED DIAGNOSES  Final diagnoses:  Alcohol-induced acute pancreatitis without infection or necrosis      NEW MEDICATIONS STARTED DURING THIS VISIT:  New Prescriptions   OXYCODONE-ACETAMINOPHEN (PERCOCET) 5-325 MG TABLET    Take 1 tablet by mouth every 4 (four) hours as needed for severe pain.   PROMETHAZINE (PHENERGAN) 25 MG TABLET    Take 1 tablet (25 mg total) by mouth every 6 (six) hours as needed.     Note:  This document was prepared using Dragon voice recognition software and may include unintentional dictation errors.    Willy Eddy, MD 12/04/19 2055

## 2020-04-22 IMAGING — CT CT ABD-PELV W/ CM
2 of 5 series · 15 of 46 positions shown, 17 images · IV contrast (APPLIED)
Comparison: None.

CLINICAL DATA: Nausea vomiting epigastric pain

EXAM:
CT ABDOMEN AND PELVIS WITH CONTRAST
TECHNIQUE: Multidetector CT imaging of the abdomen and pelvis was performed
using the standard protocol following bolus administration of
intravenous contrast.
CONTRAST:  100mL OMNIPAQUE IOHEXOL 300 MG/ML  SOLN

[Series 2: routine abd/pel with · axial · 0.69mm/px · z∈[-531,-151]mm · 12 of 86 slices shown, 14 images]
[im 5/86  soft-tissue]
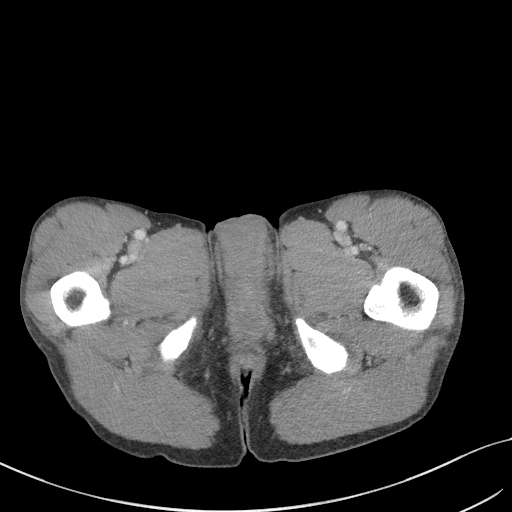
[im 5/86  bone]
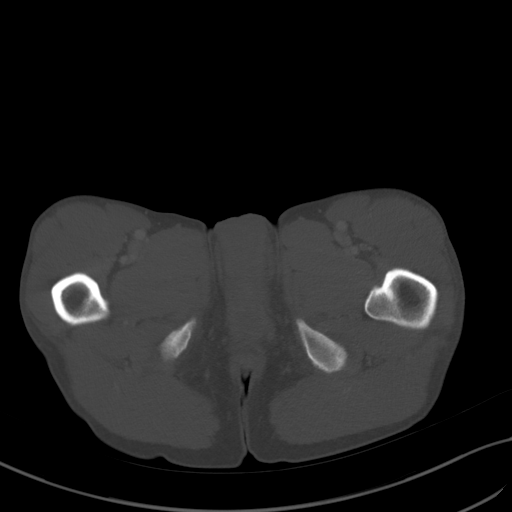
[im 14/86  soft-tissue]
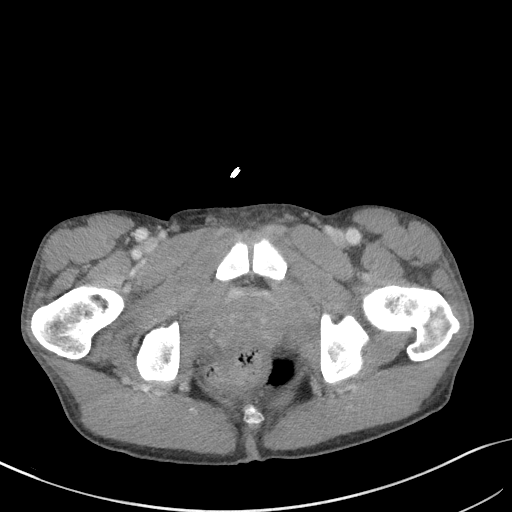
[im 18/86  soft-tissue]
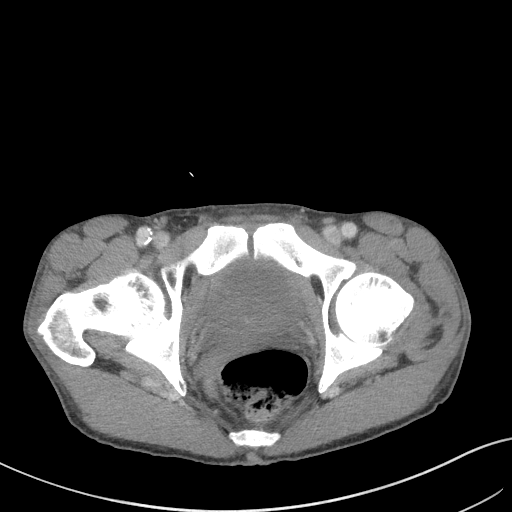
[im 27/86  soft-tissue]
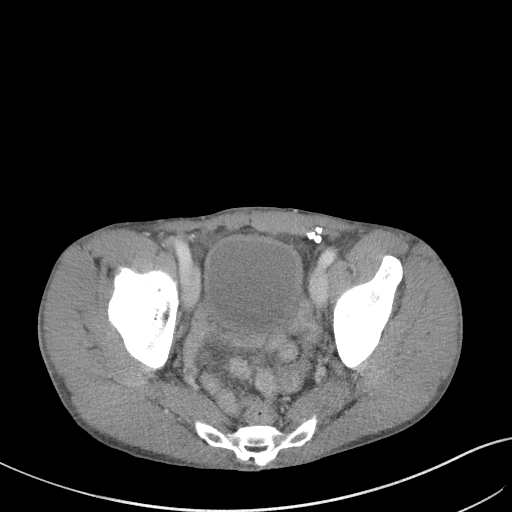
[im 32/86  soft-tissue]
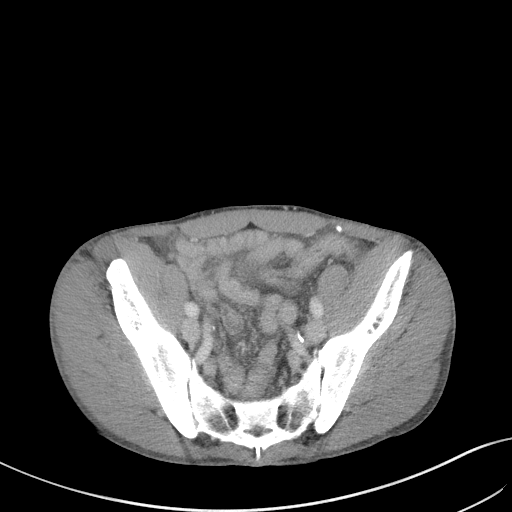
[im 41/86  soft-tissue]
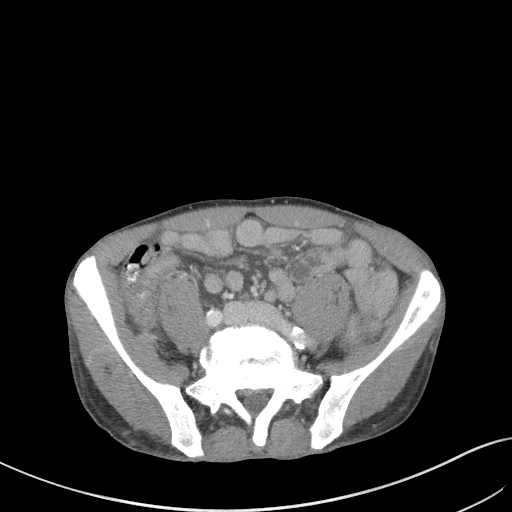
[im 45/86  soft-tissue]
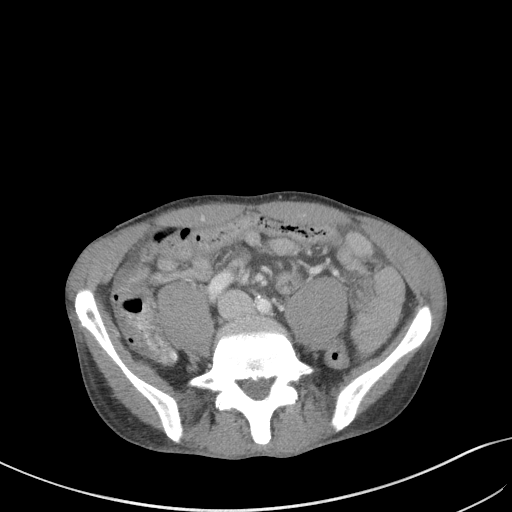
[im 54/86  soft-tissue]
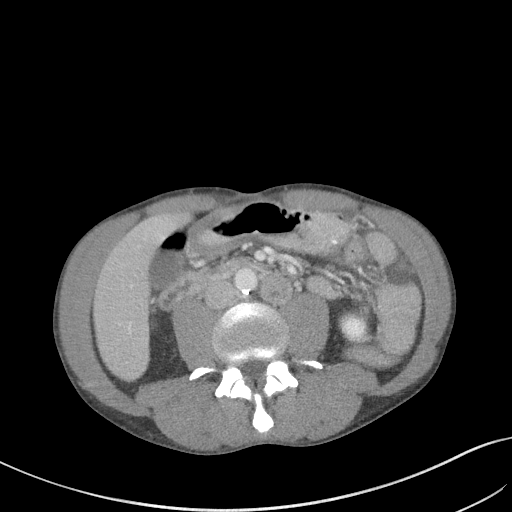
[im 59/86  soft-tissue]
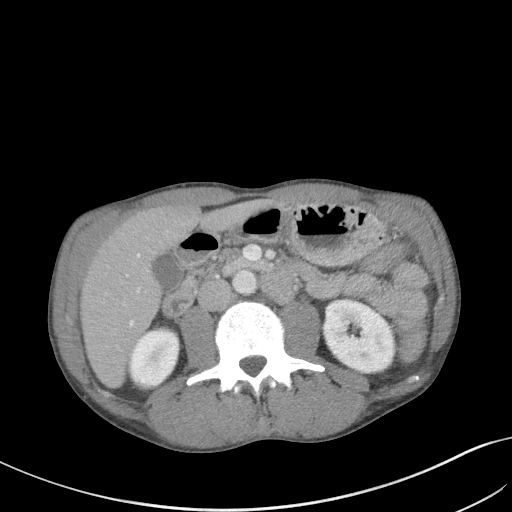
[im 59/86  bone]
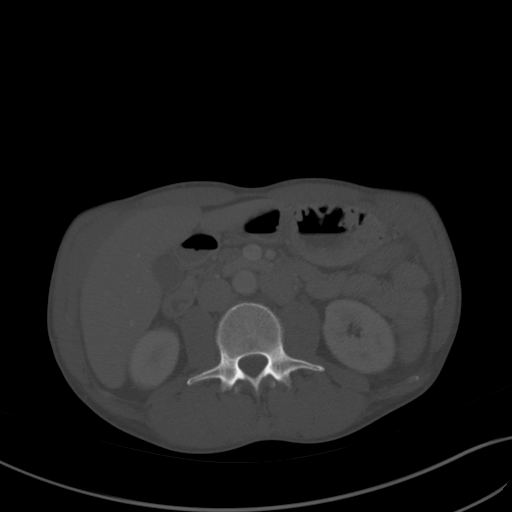
[im 68/86  soft-tissue]
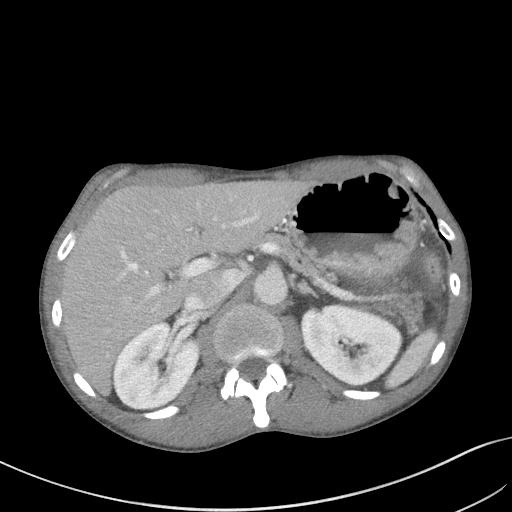
[im 72/86  soft-tissue]
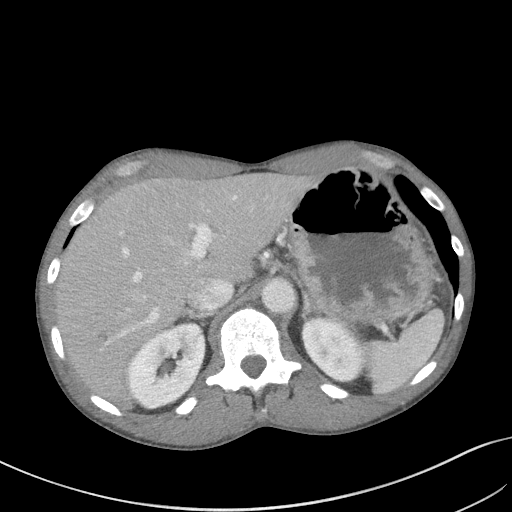
[im 81/86  soft-tissue]
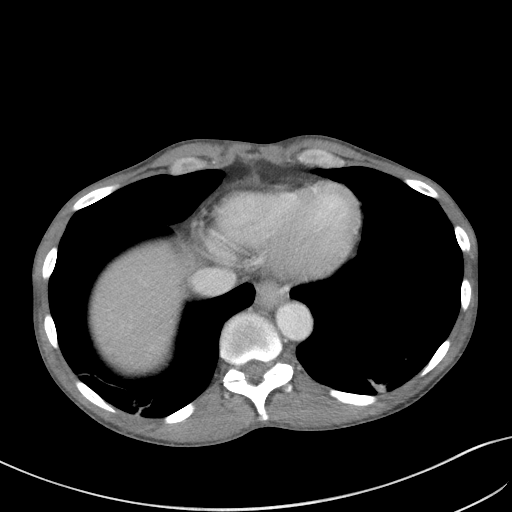

[Series 5: coronal st · coronal · 0.63mm/px · 3 of 75 slices shown]
[im 25/75  soft-tissue]
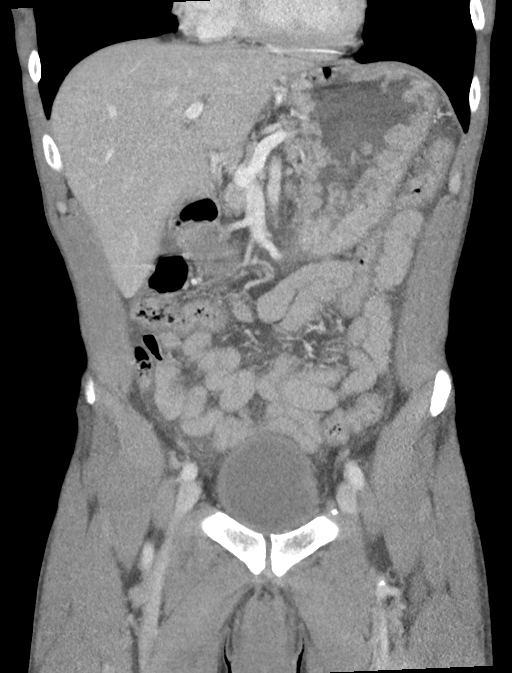
[im 33/75  soft-tissue]
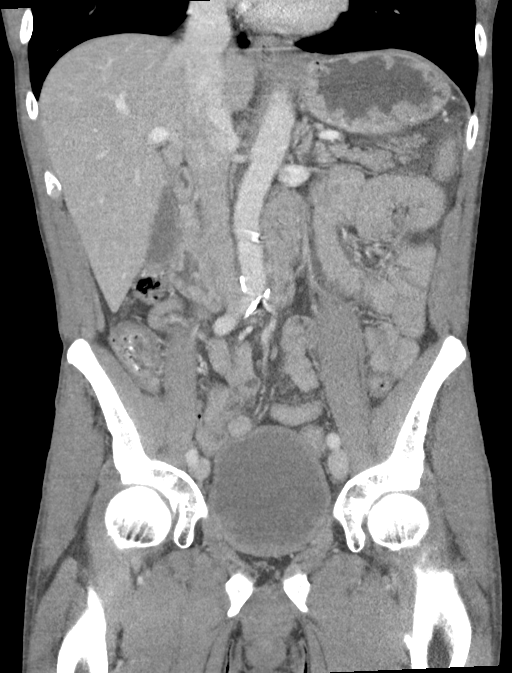
[im 42/75  soft-tissue]
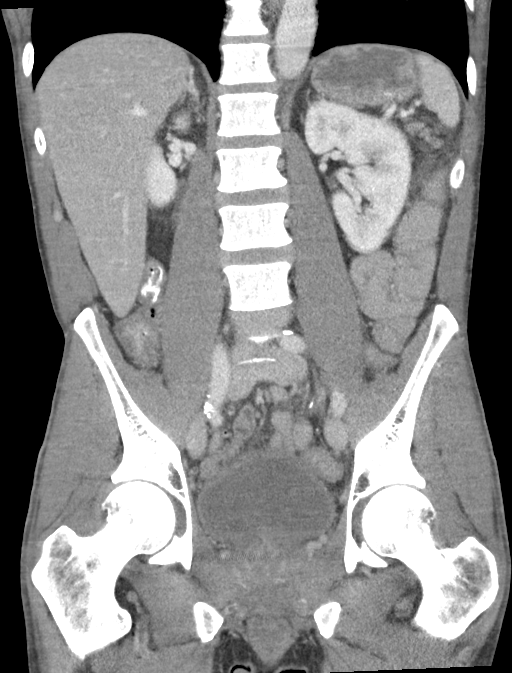

[15 of 46 positions shown; findings below may reference images not displayed]

FINDINGS: Lower chest: The visualized heart size within normal limits. No
pericardial fluid/thickening.

No hiatal hernia.

Streaky atelectasis seen at both lung bases. There is a more focal
nodular area in the posterior left lower lung measuring 1.4 cm.

Hepatobiliary: The liver is normal in density without focal
abnormality.The main portal vein is patent. No evidence of calcified
gallstones, gallbladder wall thickening or biliary dilatation.

Pancreas: There is mesenteric fat stranding changes seen around the
pancreatic tail with non loculated fluid. There is parenchymal
enhancement seen throughout the pancreas. No pseudocyst is noted. No
pancreatic ductal dilatation.

Spleen: Normal in size without focal abnormality.

Adrenals/Urinary Tract: Both adrenal glands appear normal. The
kidneys and collecting system appear normal without evidence of
urinary tract calculus or hydronephrosis. Bladder is unremarkable.

Stomach/Bowel: The stomach and small bowel are normal in appearance.
There is question of mild wall thickening seen around the cecum
which may be due to under distension. The remainder of the colon is
unremarkable.The appendix is normal.

Vascular/Lymphatic: There are no enlarged mesenteric,
retroperitoneal, or pelvic lymph nodes. Scattered aortic
atherosclerotic calcifications are seen without aneurysmal
dilatation.

Reproductive: The prostate is unremarkable.

Other: No evidence of abdominal wall mass or hernia.

Musculoskeletal: No acute or significant osseous findings.
IMPRESSION: Findings consistent with acute pancreatitis involving the tail with
adjacent inflammatory changes/phlegmon. No pancreatic necrosis or
pseudocyst seen.

Question of mild wall thickening of the cecum which may be due to
under distension.

This small focal nodular opacity in the posterior left lung base
which may be due to atelectasis or early infectious etiology.

## 2020-06-09 ENCOUNTER — Ambulatory Visit: Payer: BC Managed Care – PPO | Admitting: Internal Medicine

## 2020-11-23 ENCOUNTER — Other Ambulatory Visit: Payer: Self-pay | Admitting: *Deleted

## 2020-11-23 MED ORDER — MELOXICAM 15 MG PO TABS: 15.0000 mg | ORAL_TABLET | Freq: Every day | ORAL | 4 refills | Status: DC

## 2020-11-26 ENCOUNTER — Other Ambulatory Visit: Payer: Self-pay | Admitting: *Deleted

## 2020-11-26 MED ORDER — MELOXICAM 15 MG PO TABS: 15.0000 mg | ORAL_TABLET | Freq: Every day | ORAL | 4 refills | Status: AC

## 2021-03-12 ENCOUNTER — Other Ambulatory Visit: Payer: Self-pay | Admitting: Family Medicine

## 2021-03-13 LAB — CMP12+LP+TP+TSH+6AC+PSA+CBC…
ALT: 18 [IU]/L (ref 0–44)
AST: 25 [IU]/L (ref 0–40)
Albumin/Globulin Ratio: 2.4 — ABNORMAL HIGH (ref 1.2–2.2)
Albumin: 4.7 g/dL (ref 3.8–4.9)
Alkaline Phosphatase: 55 [IU]/L (ref 44–121)
BUN/Creatinine Ratio: 12 (ref 9–20)
BUN: 12 mg/dL (ref 6–24)
Basophils Absolute: 0.1 10*3/uL (ref 0.0–0.2)
Basos: 1 %
Bilirubin Total: 0.5 mg/dL (ref 0.0–1.2)
Calcium: 9.8 mg/dL (ref 8.7–10.2)
Chloride: 102 mmol/L (ref 96–106)
Chol/HDL Ratio: 2.8 ratio (ref 0.0–5.0)
Cholesterol, Total: 209 mg/dL — ABNORMAL HIGH (ref 100–199)
Creatinine, Ser: 0.97 mg/dL (ref 0.76–1.27)
EOS (ABSOLUTE): 0.1 10*3/uL (ref 0.0–0.4)
Eos: 3 %
Free Thyroxine Index: 1.4 (ref 1.2–4.9)
GGT: 18 [IU]/L (ref 0–65)
Globulin, Total: 2 g/dL (ref 1.5–4.5)
Glucose: 91 mg/dL (ref 65–99)
HDL: 76 mg/dL
Hematocrit: 37.9 % (ref 37.5–51.0)
Hemoglobin: 12.5 g/dL — ABNORMAL LOW (ref 13.0–17.7)
Immature Grans (Abs): 0 10*3/uL (ref 0.0–0.1)
Immature Granulocytes: 0 %
Iron: 94 ug/dL (ref 38–169)
LDH: 207 [IU]/L (ref 121–224)
LDL Chol Calc (NIH): 114 mg/dL — ABNORMAL HIGH (ref 0–99)
Lymphocytes Absolute: 1.6 10*3/uL (ref 0.7–3.1)
Lymphs: 36 %
MCH: 33.9 pg — ABNORMAL HIGH (ref 26.6–33.0)
MCHC: 33 g/dL (ref 31.5–35.7)
MCV: 103 fL — ABNORMAL HIGH (ref 79–97)
Monocytes Absolute: 0.6 10*3/uL (ref 0.1–0.9)
Monocytes: 12 %
Neutrophils Absolute: 2.1 10*3/uL (ref 1.4–7.0)
Neutrophils: 48 %
Phosphorus: 4.1 mg/dL (ref 2.8–4.1)
Platelets: 239 10*3/uL (ref 150–450)
Potassium: 4.3 mmol/L (ref 3.5–5.2)
Prostate Specific Ag, Serum: 1.6 ng/mL (ref 0.0–4.0)
RBC: 3.69 x10E6/uL — ABNORMAL LOW (ref 4.14–5.80)
RDW: 11.6 % (ref 11.6–15.4)
Sodium: 142 mmol/L (ref 134–144)
T3 Uptake Ratio: 29 % (ref 24–39)
T4, Total: 4.7 ug/dL (ref 4.5–12.0)
TSH: 1.29 u[IU]/mL (ref 0.450–4.500)
Total Protein: 6.7 g/dL (ref 6.0–8.5)
Triglycerides: 110 mg/dL (ref 0–149)
Uric Acid: 4.8 mg/dL (ref 3.8–8.4)
VLDL Cholesterol Cal: 19 mg/dL (ref 5–40)
WBC: 4.5 10*3/uL (ref 3.4–10.8)
eGFR: 92 mL/min/{1.73_m2}

## 2022-08-11 ENCOUNTER — Encounter: Payer: Self-pay | Admitting: *Deleted

## 2022-09-19 ENCOUNTER — Other Ambulatory Visit: Payer: Self-pay | Admitting: *Deleted

## 2022-09-19 DIAGNOSIS — F17210 Nicotine dependence, cigarettes, uncomplicated: Secondary | ICD-10-CM

## 2022-09-19 DIAGNOSIS — Z122 Encounter for screening for malignant neoplasm of respiratory organs: Secondary | ICD-10-CM

## 2022-09-19 DIAGNOSIS — Z87891 Personal history of nicotine dependence: Secondary | ICD-10-CM

## 2022-10-25 ENCOUNTER — Ambulatory Visit (INDEPENDENT_AMBULATORY_CARE_PROVIDER_SITE_OTHER): Payer: BC Managed Care – PPO | Admitting: Acute Care

## 2022-10-25 ENCOUNTER — Encounter: Payer: Self-pay | Admitting: Acute Care

## 2022-10-25 DIAGNOSIS — F17210 Nicotine dependence, cigarettes, uncomplicated: Secondary | ICD-10-CM

## 2022-10-25 NOTE — Progress Notes (Signed)
Virtual Visit via Telephone Note  I connected with William Stephens on 10/25/22 at  4:00 PM EST by telephone and verified that I am speaking with the correct person using two identifiers.  Location: Patient:  At home Provider:  [address] [place], [place], [address]    I discussed the limitations, risks, security and privacy concerns of performing an evaluation and management service by telephone and the availability of in person appointments. I also discussed with the patient that there may be a patient responsible charge related to this service. The patient expressed understanding and agreed to proceed.   Shared Decision Making Visit Lung Cancer Screening Program [phone])   Eligibility: Age 56 y.o. Pack Years Smoking History Calculation  20 pack year smoking history (# packs/per year x # years smoked) Recent History of coughing up blood  no Unexplained weight loss? no ( >Than 15 pounds within the last 6 months ) Prior History Lung / other cancer no (Diagnosis within the last 5 years already requiring surveillance chest CT Scans). Smoking Status Current Smoker Former Smokers: Years since quit:  NA  Quit Date:  NA  Visit Components: Discussion included one or more decision making aids. yes Discussion included risk/benefits of screening. yes Discussion included potential follow up diagnostic testing for abnormal scans. yes Discussion included meaning and risk of over diagnosis. yes Discussion included meaning and risk of False Positives. yes Discussion included meaning of total radiation exposure. yes  Counseling Included: Importance of adherence to annual lung cancer LDCT screening. yes Impact of comorbidities on ability to participate in the program. yes Ability and willingness to under diagnostic treatment. yes  Smoking Cessation Counseling: Current Smokers:  Discussed importance of smoking cessation. yes Information about tobacco cessation classes and  interventions provided to patient. yes Patient provided with "ticket" for LDCT Scan. yes Symptomatic Patient. no  Counseling NA Diagnosis Code: NA Asymptomatic Patient yes  Counseling (Intermediate counseling: > three minutes counseling) [id number] Former Smokers:  Discussed the importance of maintaining cigarette abstinence. yes Diagnosis Code: Personal History of Nicotine Dependence. N05.397 Information about tobacco cessation classes and interventions provided to patient. Yes Patient provided with "ticket" for LDCT Scan. yes Written Order for Lung Cancer Screening with LDCT placed in Epic. Yes (CT Chest Lung Cancer Screening Low Dose W/O CM) [id number] Z12.2-Screening of respiratory organs Z87.891-Personal history of nicotine dependence  I have spent 25 minutes of face to face/ virtual visit   time with  William Stephens discussing the risks and benefits of lung cancer screening. We viewed / discussed a power point together that explained in detail the above noted topics. We paused at intervals to allow for questions to be asked and answered to ensure understanding.We discussed that the single most powerful action that he can take to decrease his risk of developing lung cancer is to quit smoking. We discussed whether or not he is ready to commit to setting a quit date. We discussed options for tools to aid in quitting smoking including nicotine replacement therapy, non-nicotine medications, support groups, Quit Smart classes, and behavior modification. We discussed that often times setting smaller, more achievable goals, such as eliminating 1 cigarette a day for a week and then 2 cigarettes a day for a week can be helpful in slowly decreasing the number of cigarettes smoked. This allows for a sense of accomplishment as well as providing a clinical benefit. I provided  him  with smoking cessation  information  with contact information for community resources, classes,  free nicotine replacement therapy, and  access to mobile apps, text messaging, and on-line smoking cessation help. I have also provided  him  the office contact information in the event he needs to contact me, or the screening staff. We discussed the time and location of the scan, and that either Abigail Miyamoto RN, Karlton Lemon, RN  or I will call / send a letter with the results within 24-72 hours of receiving them. The patient verbalized understanding of all of  the above and had no further questions upon leaving the office. They have my contact information in the event they have any further questions.  I spent 3 minutes counseling on smoking cessation and the health risks of continued tobacco abuse.  I explained to the patient that there has been a high incidence of coronary artery disease noted on these exams. I explained that this is a non-gated exam therefore degree or severity cannot be determined. This patient is noton statin therapy. I have asked the patient to follow-up with their PCP regarding any incidental finding of coronary artery disease and management with diet or medication as their PCP  feels is clinically indicated. The patient verbalized understanding of the above and had no further questions upon completion of the visit.      Bevelyn Ngo, NP 10/25/2022

## 2022-10-25 NOTE — Patient Instructions (Signed)
Thank you for participating in the [facility]. It was our pleasure to meet you today. We will call you with the results of your scan within the next few days. Your scan will be assigned a Lung RADS category score by the physicians reading the scans.  This Lung RADS score determines follow up scanning.  See below for description of categories, and follow up screening recommendations. We will be in touch to schedule your follow up screening annually or based on recommendations of our providers. We will fax a copy of your scan results to your Primary Care Physician, or the physician who referred you to the program, to ensure they have the results. Please call the office if you have any questions or concerns regarding your scanning experience or results.  Our office number is [phone]. Please speak with Denise Phelps, RN. , or  Denise Buckner RN, They are  our Lung Cancer Screening RN.'s If They are unavailable when you call, Please leave a message on the voice mail. We will return your call at our earliest convenience.This voice mail is monitored several times a day.  Remember, if your scan is normal, we will scan you annually as long as you continue to meet the criteria for the program. (Age 55-77, Current smoker or smoker who has quit within the last 15 years). If you are a smoker, remember, quitting is the single most powerful action that you can take to decrease your risk of lung cancer and other pulmonary, breathing related problems. We know quitting is hard, and we are here to help.  Please let us know if there is anything we can do to help you meet your goal of quitting. If you are a former smoker, congratulations. We are proud of you! Remain smoke free! Remember you can refer friends or family members through the number above.  We will screen them to make sure they meet criteria for the program. Thank you for helping us take better care of you by  participating in Lung Screening.  You can receive free nicotine replacement therapy ( patches, gum or mints) by calling 1-800-QUIT NOW. Please call so we can get you on the path to becoming  a non-smoker. I know it is hard, but you can do this!  Lung RADS Categories:  Lung RADS 1: no nodules or definitely non-concerning nodules.  Recommendation is for a repeat annual scan in 12 months.  Lung RADS 2:  nodules that are non-concerning in appearance and behavior with a very low likelihood of becoming an active cancer. Recommendation is for a repeat annual scan in 12 months.  Lung RADS 3: nodules that are probably non-concerning , includes nodules with a low likelihood of becoming an active cancer.  Recommendation is for a 6-month repeat screening scan. Often noted after an upper respiratory illness. We will be in touch to make sure you have no questions, and to schedule your 6-month scan.  Lung RADS 4 A: nodules with concerning findings, recommendation is most often for a follow up scan in 3 months or additional testing based on our provider's assessment of the scan. We will be in touch to make sure you have no questions and to schedule the recommended 3 month follow up scan.  Lung RADS 4 B:  indicates findings that are concerning. We will be in touch with you to schedule additional diagnostic testing based on our provider's  assessment of the scan.  Other options for assistance in smoking cessation (   As covered by your insurance benefits)  Hypnosis for smoking cessation  [employer] [phone]  Acupuncture for smoking cessation  [facility] [phone]   

## 2022-10-26 ENCOUNTER — Ambulatory Visit
Admission: RE | Admit: 2022-10-26 | Discharge: 2022-10-26 | Disposition: A | Discharge status: Home / Self Care | Payer: BC Managed Care – PPO | Source: Ambulatory Visit | Attending: Acute Care | Admitting: Acute Care

## 2022-10-26 DIAGNOSIS — Z122 Encounter for screening for malignant neoplasm of respiratory organs: Secondary | ICD-10-CM | POA: Insufficient documentation

## 2022-10-26 DIAGNOSIS — Z87891 Personal history of nicotine dependence: Secondary | ICD-10-CM | POA: Insufficient documentation

## 2022-10-26 DIAGNOSIS — F17210 Nicotine dependence, cigarettes, uncomplicated: Secondary | ICD-10-CM | POA: Insufficient documentation

## 2022-10-28 ENCOUNTER — Telehealth: Payer: Self-pay | Admitting: *Deleted

## 2022-10-28 DIAGNOSIS — Z122 Encounter for screening for malignant neoplasm of respiratory organs: Secondary | ICD-10-CM

## 2022-10-28 DIAGNOSIS — F17210 Nicotine dependence, cigarettes, uncomplicated: Secondary | ICD-10-CM

## 2022-10-28 DIAGNOSIS — Z87891 Personal history of nicotine dependence: Secondary | ICD-10-CM

## 2022-10-28 NOTE — Telephone Encounter (Signed)
Spoke with pt regarding lung screening CT results. Pt advised that there were small nodules seen that will be followed on his yearly scan. Also noted was Moderate emphysema and coronary calcifications seen. Discussed with pt an area of gastric thickening. PT does report that he takes reflux meds daily but still continue to have some stomach upset. I advised pt that we are sending a copy of the CT report to Carren Rang and advised pt to follow up with her regarding symptoms he is having and for further follow up. CT results faxed to PCP. Order placed for 12 mth follow up lung screening CT.

## 2023-04-14 ENCOUNTER — Ambulatory Visit (INDEPENDENT_AMBULATORY_CARE_PROVIDER_SITE_OTHER): Payer: BC Managed Care – PPO

## 2023-04-14 DIAGNOSIS — F1010 Alcohol abuse, uncomplicated: Secondary | ICD-10-CM | POA: Diagnosis not present

## 2023-04-14 DIAGNOSIS — R110 Nausea: Secondary | ICD-10-CM | POA: Diagnosis not present

## 2023-04-14 DIAGNOSIS — K219 Gastro-esophageal reflux disease without esophagitis: Secondary | ICD-10-CM | POA: Diagnosis present

## 2023-09-19 ENCOUNTER — Other Ambulatory Visit: Payer: Self-pay | Admitting: Acute Care

## 2023-09-19 DIAGNOSIS — F17210 Nicotine dependence, cigarettes, uncomplicated: Secondary | ICD-10-CM

## 2023-09-19 DIAGNOSIS — Z87891 Personal history of nicotine dependence: Secondary | ICD-10-CM

## 2023-09-19 DIAGNOSIS — Z122 Encounter for screening for malignant neoplasm of respiratory organs: Secondary | ICD-10-CM

## 2023-10-30 ENCOUNTER — Ambulatory Visit
Admission: RE | Admit: 2023-10-30 | Discharge: 2023-10-30 | Disposition: A | Discharge status: Home / Self Care | Payer: BC Managed Care – PPO | Source: Ambulatory Visit | Attending: Acute Care | Admitting: Acute Care

## 2023-10-30 DIAGNOSIS — F17210 Nicotine dependence, cigarettes, uncomplicated: Secondary | ICD-10-CM | POA: Diagnosis present

## 2023-10-30 DIAGNOSIS — Z87891 Personal history of nicotine dependence: Secondary | ICD-10-CM | POA: Diagnosis present

## 2023-10-30 DIAGNOSIS — Z122 Encounter for screening for malignant neoplasm of respiratory organs: Secondary | ICD-10-CM | POA: Diagnosis present

## 2023-11-29 ENCOUNTER — Telehealth: Payer: Self-pay | Admitting: Acute Care

## 2023-11-29 DIAGNOSIS — R911 Solitary pulmonary nodule: Secondary | ICD-10-CM
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
## 2023-12-18 ENCOUNTER — Other Ambulatory Visit: Payer: Self-pay

## 2023-12-18 ENCOUNTER — Emergency Department
Admission: EM | Admit: 2023-12-18 | Discharge: 2023-12-19 | Disposition: A | Discharge status: Home / Self Care | Payer: BC Managed Care – PPO | Attending: Emergency Medicine | Admitting: Emergency Medicine

## 2023-12-18 ENCOUNTER — Emergency Department: Payer: BC Managed Care – PPO

## 2023-12-18 DIAGNOSIS — F17210 Nicotine dependence, cigarettes, uncomplicated: Secondary | ICD-10-CM | POA: Diagnosis not present

## 2023-12-18 DIAGNOSIS — R112 Nausea with vomiting, unspecified: Secondary | ICD-10-CM

## 2023-12-18 DIAGNOSIS — R0789 Other chest pain: Secondary | ICD-10-CM

## 2023-12-18 DIAGNOSIS — K2900 Acute gastritis without bleeding: Secondary | ICD-10-CM | POA: Insufficient documentation

## 2023-12-18 LAB — BASIC METABOLIC PANEL
Anion gap: 16 — ABNORMAL HIGH (ref 5–15)
BUN: 17 mg/dL (ref 6–20)
CO2: 22 mmol/L (ref 22–32)
Calcium: 9.5 mg/dL (ref 8.9–10.3)
Chloride: 99 mmol/L (ref 98–111)
Creatinine, Ser: 0.75 mg/dL (ref 0.61–1.24)
GFR, Estimated: >60 mL/min (ref >60)
Glucose, Bld: 135 mg/dL — ABNORMAL HIGH (ref 70–99)
Potassium: 3.6 mmol/L (ref 3.5–5.1)
Sodium: 137 mmol/L (ref 135–145)

## 2023-12-18 LAB — CBC
HCT: 37.9 % — ABNORMAL LOW (ref 39.0–52.0)
Hemoglobin: 12.5 g/dL — ABNORMAL LOW (ref 13.0–17.0)
MCH: 33.4 pg (ref 26.0–34.0)
MCHC: 33 g/dL (ref 30.0–36.0)
MCV: 101.3 fL — ABNORMAL HIGH (ref 80.0–100.0)
Platelets: 233 10*3/uL (ref 150–400)
RBC: 3.74 MIL/uL — ABNORMAL LOW (ref 4.22–5.81)
RDW: 12.1 % (ref 11.5–15.5)
WBC: 8.9 10*3/uL (ref 4.0–10.5)
nRBC: 0 % (ref 0.0–0.2)

## 2023-12-18 LAB — TROPONIN I (HIGH SENSITIVITY)
Troponin I (High Sensitivity): 7 ng/L (ref <18)
Troponin I (High Sensitivity): 6 ng/L (ref <18)

## 2023-12-18 NOTE — ED Triage Notes (Signed)
 Pt reports left side chest discomfort that began earlier this afternoon, pain is associated with nausea. Pt reports being congested recently. Pt denies known cardiac hx.

## 2023-12-19 MED ORDER — SUCRALFATE 1 G PO TABS
1.0000 g | ORAL_TABLET | Freq: Once | ORAL | Status: AC
  Administered 2023-12-19: 1 g via ORAL
  Filled 2023-12-19: qty 1

## 2023-12-19 MED ORDER — OXYCODONE-ACETAMINOPHEN 5-325 MG PO TABS
1.0000 | ORAL_TABLET | Freq: Once | ORAL | Status: AC
  Administered 2023-12-19: 1 via ORAL
  Filled 2023-12-19: qty 1

## 2023-12-19 MED ORDER — ONDANSETRON 4 MG PO TBDP: ORAL_TABLET | ORAL | 0 refills | Status: AC

## 2023-12-19 MED ORDER — SUCRALFATE 1 G PO TABS: 1.0000 g | ORAL_TABLET | Freq: Four times a day (QID) | ORAL | 1 refills | Status: AC | PRN

## 2023-12-19 MED ORDER — ONDANSETRON 4 MG PO TBDP
4.0000 mg | ORAL_TABLET | Freq: Once | ORAL | Status: AC
  Administered 2023-12-19: 4 mg via ORAL
  Filled 2023-12-19: qty 1

## 2023-12-19 MED ORDER — ALUM & MAG HYDROXIDE-SIMETH 200-200-20 MG/5ML PO SUSP
15.0000 mL | Freq: Once | ORAL | Status: AC
  Administered 2023-12-19: 15 mL via ORAL
  Filled 2023-12-19: qty 30

## 2023-12-19 NOTE — ED Provider Notes (Signed)
 [facility] Provider Note    Event Date/Time   First MD Initiated Contact with Patient 12/18/23 2355     (approximate)   History   Chest Pain   HPI William Stephens is a 58 y.o. male who smokes cigarettes but otherwise has no chronic medical issues.  He presents for evaluation of about a day of symptoms includes nausea, vomiting, diarrhea, and pain in the left center of his chest.  He said that he and his wife just recently got back from a cruise about 2 days prior to the onset of the symptoms.  He is not having any trouble breathing and his stomach feels messed up.  He is not having abdominal pain per se but an upset stomach that is making it difficult for him to eat and drink.  He has no cardiac history, no history of blood clots in the legs of the lungs, and has had some issues before that required him to have an endoscopy and they told him he had some gastritis at that time.     Physical Exam   Triage Vital Signs: ED Triage Vitals  Encounter Vitals Group     BP 12/18/23 1954 (!) 166/100     Systolic BP Percentile --      Diastolic BP Percentile --      Pulse Rate 12/18/23 1954 67     Resp 12/18/23 1954 18     Temp 12/18/23 1954 98.2 F (36.8 C)     Temp Source 12/18/23 1954 Oral     SpO2 12/18/23 1954 100 %     Weight 12/18/23 1953 65.8 kg (145 lb)     Height 12/18/23 1953 1.88 m (6' 2)     Head Circumference --      Peak Flow --      Pain Score 12/18/23 1953 6     Pain Loc --      Pain Education --      Exclude from Growth Chart --     Most recent vital signs: Vitals:   12/18/23 1954 12/18/23 2253  BP: (!) 166/100 (!) 176/98  Pulse: 67 71  Resp: 18 18  Temp: 98.2 F (36.8 C) 98.1 F (36.7 C)  SpO2: 100% 100%    General: Awake, no distress.  Generally well-appearing. CV:  Good peripheral perfusion.  Regular rate and rhythm, normal heart sounds.  No reproducible chest wall tenderness to palpation. Resp:  Normal effort. Speaking  easily and comfortably, no accessory muscle usage nor intercostal retractions.  Lungs are clear to auscultation. Abd:  No distention.  Thin body habitus.  No tenderness to palpation throughout the abdomen including in the epigastrium or right upper quadrant with no rebound or guarding.   ED Results / Procedures / Treatments   Labs (all labs ordered are listed, but only abnormal results are displayed) Labs Reviewed  BASIC METABOLIC PANEL - Abnormal; Notable for the following components:      Result Value   Glucose, Bld 135 (*)    Anion gap 16 (*)    All other components within normal limits  CBC - Abnormal; Notable for the following components:   RBC 3.74 (*)    Hemoglobin 12.5 (*)    HCT 37.9 (*)    MCV 101.3 (*)    All other components within normal limits  TROPONIN I (HIGH SENSITIVITY)  TROPONIN I (HIGH SENSITIVITY)     EKG  ED ECG REPORT I, Darleene Dome, the attending  physician, personally viewed and interpreted this ECG.  Date: 12/18/2023 EKG Time: 19: 52 Rate: 60 Rhythm: normal sinus rhythm QRS Axis: normal Intervals: normal ST/T Wave abnormalities: normal Narrative Interpretation: no evidence of acute ischemia    RADIOLOGY I viewed and interpreted the patient's two-view chest x-ray.  I see no evidence of pneumonia or pneumothorax.  I also read the radiologist's report, which confirmed no acute findings.   PROCEDURES:  Critical Care performed: No  Procedures    IMPRESSION / MDM / ASSESSMENT AND PLAN / ED COURSE  I reviewed the triage vital signs and the nursing notes.                              Differential diagnosis includes, but is not limited to, GI pathogen such as a viral gastroenteritis, gastritis, acid reflux, biliary colic, ACS, PE, pneumonia, pneumothorax.  Patient's presentation is most consistent with acute presentation with potential threat to life or bodily function.  Labs/studies ordered: 2 view chest x-ray, EKG, CBC,  high-sensitivity troponin x 2, basic metabolic panel.  Interventions/Medications given:  Medications  ondansetron  (ZOFRAN -ODT) disintegrating tablet 4 mg (4 mg Oral Given 12/19/23 0053)  oxyCODONE -acetaminophen  (PERCOCET/ROXICET) 5-325 MG per tablet 1 tablet (1 tablet Oral Given 12/19/23 0053)  sucralfate  (CARAFATE ) tablet 1 g (1 g Oral Given 12/19/23 0053)  alum & mag hydroxide-simeth (MAALOX/MYLANTA) 200-200-20 MG/5ML suspension 15 mL (15 mLs Oral Given 12/19/23 0053)    (Note:  hospital course my include additional interventions and/or labs/studies not listed above.)   Patient is hypertensive but otherwise his vitals are normal including SpO2 of 100% and a heart rate of 71.  Well score for PE is 0 and even though he has recently been on a cruise I think it is very unlikely the symptoms are due to PE.  Similarly he is low risk for ACS based HEAR score and he has 2 negative high-sensitivity troponins and a normal EKG.  The patient is having gastroenteritis/gastritis symptoms in the setting of a recent cruise and I think he is experiencing atypical chest pain as a result of his GI discomfort.  I provided medications as listed above and recommended following up with his outpatient provider and wrote prescriptions as listed below.  We talked about referral to cardiology but he does not feel strongly about it and I do not think it would be beneficial for him at this time.  I gave strict return precautions and he understands and agrees with the plan.  No indication of any acute medical condition requiring hospitalization or additional treatment.         FINAL CLINICAL IMPRESSION(S) / ED DIAGNOSES   Final diagnoses:  Atypical chest pain  Nausea vomiting and diarrhea  Acute gastritis without hemorrhage, unspecified gastritis type     Rx / DC Orders   ED Discharge Orders          Ordered    ondansetron  (ZOFRAN -ODT) 4 MG disintegrating tablet        12/19/23 0100    sucralfate  (CARAFATE ) 1 g  tablet  4 times daily PRN        12/19/23 0100             Note:  This document was prepared using Dragon voice recognition software and may include unintentional dictation errors.   Gordan Huxley, MD 12/19/23 [phone]

## 2023-12-19 NOTE — Discharge Instructions (Addendum)
 We believe your symptoms are caused by either a viral infection or possibly a bad food exposure.  Either way, since your symptoms have improved, we feel it is safe for you to go home and follow up with your regular doctor.  Please read the included information and stick to a bland diet for the next two days.  Drink plenty of clear fluids, and if you were provided with a prescription, please take it according to the label instructions.    If you develop any new or worsening symptoms, including persistent vomiting not controlled with medication, fever greater than 101, severe or worsening abdominal pain, or other symptoms that concern you, please return immediately to the Emergency Department.

## 2024-06-25 ENCOUNTER — Encounter: Payer: Self-pay | Admitting: Acute Care

## 2024-09-02 ENCOUNTER — Ambulatory Visit
